# Patient Record
Sex: Female | Born: 1975 | Race: Black or African American | Hispanic: No | Marital: Single | Smoking: Never smoker
Health system: Southern US, Community
[De-identification: ages and names within clinical notes are randomized; demographics above are authoritative.]

## PROBLEM LIST (undated history)

## (undated) DIAGNOSIS — E282 Polycystic ovarian syndrome: Secondary | ICD-10-CM

## (undated) DIAGNOSIS — T148XXA Other injury of unspecified body region, initial encounter: Secondary | ICD-10-CM

## (undated) DIAGNOSIS — R011 Cardiac murmur, unspecified: Secondary | ICD-10-CM

## (undated) DIAGNOSIS — N63 Unspecified lump in unspecified breast: Secondary | ICD-10-CM

## (undated) DIAGNOSIS — Z9889 Other specified postprocedural states: Secondary | ICD-10-CM

## (undated) DIAGNOSIS — D649 Anemia, unspecified: Secondary | ICD-10-CM

## (undated) DIAGNOSIS — K219 Gastro-esophageal reflux disease without esophagitis: Secondary | ICD-10-CM

## (undated) DIAGNOSIS — R112 Nausea with vomiting, unspecified: Secondary | ICD-10-CM

## (undated) HISTORY — PX: COLONOSCOPY: SHX174

## (undated) HISTORY — PX: UPPER GI ENDOSCOPY: SHX6162

## (undated) HISTORY — DX: Unspecified lump in unspecified breast: N63.0

---
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
## 2011-07-19 ENCOUNTER — Other Ambulatory Visit (HOSPITAL_COMMUNITY)
Admission: RE | Admit: 2011-07-19 | Discharge: 2011-07-19 | Disposition: A | Discharge status: Home / Self Care | Payer: BC Managed Care – PPO | Source: Ambulatory Visit | Attending: Obstetrics & Gynecology | Admitting: Obstetrics & Gynecology

## 2011-07-19 DIAGNOSIS — Z1159 Encounter for screening for other viral diseases: Secondary | ICD-10-CM | POA: Insufficient documentation

## 2011-07-19 DIAGNOSIS — Z124 Encounter for screening for malignant neoplasm of cervix: Secondary | ICD-10-CM | POA: Insufficient documentation

## 2011-08-16 ENCOUNTER — Encounter (INDEPENDENT_AMBULATORY_CARE_PROVIDER_SITE_OTHER): Payer: Self-pay | Admitting: General Surgery

## 2011-08-16 ENCOUNTER — Ambulatory Visit (INDEPENDENT_AMBULATORY_CARE_PROVIDER_SITE_OTHER): Payer: BC Managed Care – PPO | Admitting: General Surgery

## 2011-08-16 VITALS — BP 114/82 | HR 72 | Resp 18 | Ht 68.5 in | Wt 218.0 lb

## 2011-08-16 DIAGNOSIS — N63 Unspecified lump in unspecified breast: Secondary | ICD-10-CM

## 2011-08-16 DIAGNOSIS — N6312 Unspecified lump in the right breast, upper inner quadrant: Secondary | ICD-10-CM

## 2011-08-16 NOTE — Patient Instructions (Signed)
Perform monthly self breast exam. If you notice changes, please call.

## 2011-08-16 NOTE — Progress Notes (Signed)
Patient ID: Katie Sellers, female   DOB: April 18, 1976, 36 y.o.   MRN: [id number]  Chief Complaint  Patient presents with  . Breast Mass    new pt- eval Rt breast mass    HPI Katie Sellers is a 36 y.o. female.   HPI 36 year old [place] American female referred by Dr. Christell Constant for a right breast lump. The patient states that she has had a lump in that area for many years. She states that it does not cause any discomfort or pain. She states that new physician noticed it and pointed it out on physical exam. She was brought back several weeks later for followup exam and that physician noticed that the area had increased in size. This prompted mammogram and right breast ultrasound. She states that she has had some intermittent right breast nipple discharge. It is both spontaneous and occurs on palpation sometimes. It is clear. Menarche occurred at age 75. She has had irregular menses ever since she started menstruating. She generally has heavy flow. She has been oral contraceptives for her heavy flow. However she recently stopped them. She denies any personal or family history of breast cancer, colon cancer, prostate cancer or ovarian cancer. Her father had melanoma. Past Medical History  Diagnosis Date  . Breast mass     right breast    Past Surgical History  Procedure Date  . Foot surgery 1989    bilateral    Family History  Problem Relation Age of Onset  . Heart disease Father   . Heart disease Paternal Grandmother     Social History History  Substance Use Topics  . Smoking status: Never Smoker   . Smokeless tobacco: Not on file  . Alcohol Use: No    Allergies  Allergen Reactions  . Penicillins Hives  . Tramadol Hives    No current outpatient prescriptions on file.    Review of Systems Review of Systems  Constitutional: Negative for fever, chills and unexpected weight change.  HENT: Negative for hearing loss, congestion, sore throat, trouble swallowing and voice change.     Eyes: Negative for visual disturbance.  Respiratory: Negative for cough and wheezing.   Cardiovascular: Negative for chest pain, palpitations and leg swelling.  Gastrointestinal: Negative for nausea, vomiting, abdominal pain, diarrhea, constipation, blood in stool, abdominal distention and anal bleeding.  Genitourinary: Positive for vaginal bleeding and menstrual problem. Negative for hematuria and difficulty urinating.  Musculoskeletal: Negative for arthralgias.  Skin: Negative for rash and wound.  Neurological: Negative for seizures, syncope and headaches.  Hematological: Negative for adenopathy. Does not bruise/bleed easily.  Psychiatric/Behavioral: Negative for confusion.    Blood pressure 114/82, pulse 72, resp. rate 18, height 5' 8.5" (1.74 m), weight 218 lb (98.884 kg).  Physical Exam Physical Exam  Vitals reviewed. Constitutional: She is oriented to person, place, and time. She appears well-developed and well-nourished. No distress.  HENT:  Head: Normocephalic and atraumatic.  Right Ear: External ear normal.  Left Ear: External ear normal.  Nose: Nose normal.  Eyes: Conjunctivae are normal.  Neck: Normal range of motion. Neck supple. No tracheal deviation present. No thyromegaly present.  Cardiovascular: Normal rate, regular rhythm and normal heart sounds.   Pulmonary/Chest: Effort normal and breath sounds normal. No respiratory distress. She has no wheezes. Right breast exhibits nipple discharge (clear, tiny drop on compression). Right breast exhibits no inverted nipple, no skin change and no tenderness. Left breast exhibits no inverted nipple, no nipple discharge, no skin change and no tenderness.  Breasts are symmetrical.         subcu breast lump at 1o'clock about 6cm from NAC. Ill defined. Nontender. No overlying skin changes. About 1 inch x 0.5 inch  Abdominal: Soft. Bowel sounds are normal. She exhibits no distension. There is no tenderness.  Musculoskeletal: Normal  range of motion. She exhibits no edema and no tenderness.  Lymphadenopathy:    She has no cervical adenopathy.    She has no axillary adenopathy.       Right: No supraclavicular adenopathy present.       Left: No supraclavicular adenopathy present.  Neurological: She is alert and oriented to person, place, and time. She exhibits normal muscle tone.  Skin: Skin is warm and dry. No rash noted. She is not diaphoretic. No erythema.  Psychiatric: She has a normal mood and affect. Her behavior is normal. Judgment and thought content normal.    Data Reviewed Dr Paralee Cancel note B/l mammo and rt breast u/s report and images from 08/11/11  Assessment    Right breast lump at 1 o'clock    Plan    She does have a small palpable lump in her right breast. To me this is consistent with fibrocystic breast. Her mammogram and ultrasound are reassuring. However I would like to bring the patient back in 3 months for another exam. If the lump is still present, we will discuss incisional biopsy. I also encouraged her to self breast exams. I instructed her to call our office should she notice a change.  We also discussed fibrocystic breast disease.  Followup 3 months  Mary Sella. Andrey Campanile, MD, FACS General, Bariatric, & Minimally Invasive Surgery [facility], [place]        [facility] M 08/16/2011, 10:53 AM

## 2011-08-30 ENCOUNTER — Encounter (INDEPENDENT_AMBULATORY_CARE_PROVIDER_SITE_OTHER): Payer: Self-pay

## 2011-10-09 ENCOUNTER — Encounter (INDEPENDENT_AMBULATORY_CARE_PROVIDER_SITE_OTHER): Payer: Self-pay | Admitting: General Surgery

## 2015-08-17 ENCOUNTER — Emergency Department (HOSPITAL_BASED_OUTPATIENT_CLINIC_OR_DEPARTMENT_OTHER)
Admission: EM | Admit: 2015-08-17 | Discharge: 2015-08-17 | Disposition: A | Discharge status: Home / Self Care | Payer: No Typology Code available for payment source | Attending: Emergency Medicine | Admitting: Emergency Medicine

## 2015-08-17 ENCOUNTER — Encounter (HOSPITAL_BASED_OUTPATIENT_CLINIC_OR_DEPARTMENT_OTHER): Payer: Self-pay | Admitting: *Deleted

## 2015-08-17 ENCOUNTER — Emergency Department (HOSPITAL_BASED_OUTPATIENT_CLINIC_OR_DEPARTMENT_OTHER): Payer: No Typology Code available for payment source

## 2015-08-17 DIAGNOSIS — S39012A Strain of muscle, fascia and tendon of lower back, initial encounter: Secondary | ICD-10-CM | POA: Insufficient documentation

## 2015-08-17 DIAGNOSIS — Y999 Unspecified external cause status: Secondary | ICD-10-CM | POA: Diagnosis not present

## 2015-08-17 DIAGNOSIS — Y9389 Activity, other specified: Secondary | ICD-10-CM | POA: Insufficient documentation

## 2015-08-17 DIAGNOSIS — S8002XA Contusion of left knee, initial encounter: Secondary | ICD-10-CM | POA: Diagnosis not present

## 2015-08-17 DIAGNOSIS — Y929 Unspecified place or not applicable: Secondary | ICD-10-CM | POA: Insufficient documentation

## 2015-08-17 DIAGNOSIS — S161XXA Strain of muscle, fascia and tendon at neck level, initial encounter: Secondary | ICD-10-CM | POA: Diagnosis not present

## 2015-08-17 DIAGNOSIS — S199XXA Unspecified injury of neck, initial encounter: Secondary | ICD-10-CM | POA: Diagnosis present

## 2015-08-17 LAB — PREGNANCY, URINE: PREG TEST UR: NEGATIVE
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
## 2017-02-20 ENCOUNTER — Encounter (HOSPITAL_COMMUNITY): Payer: Self-pay

## 2017-02-20 NOTE — Patient Instructions (Addendum)
Katie Sellers  02/20/2017           Katie Sellers  02/20/2017      Your procedure is scheduled on 02/27/17   Report to [place].M.  Call this number if you have problems the morning of surgery:[phone]             OUR ADDRESS IS [address] , WE ARE LOCATED IN [place].    Remember:  Do not eat food or drink liquids after midnight.  Take these medicines the morning of surgery with A SIP OF WATER  Do not wear jewelry, make-up or nail polish.  Do not wear lotions, powders, or perfumes, or deoderant.  Do not shave 48 hours prior to surgery.  Men may shave face and neck.  Do not bring valuables to the hospital.  [facility] is not responsible for any belongings or valuables.  Contacts, dentures or bridgework may not be worn into surgery.  Leave your suitcase in the car.  After surgery it may be brought to your room.  For patients admitted to the hospital, discharge time will be determined by your treatment team.   Special instructions:   Please read over the following fact sheets that you were given.    __________________________________________________________             [facility] - Preparing for Surgery Before surgery, you can play an important role.  Because skin is not sterile, your skin needs to be as free of germs as possible.  You can reduce the number of germs on your skin by washing with CHG (chlorahexidine gluconate) soap before surgery.  CHG is an antiseptic cleaner which kills germs and bonds with the skin to continue killing germs even after washing. Please DO NOT use if you have an allergy to CHG or antibacterial soaps.  If your skin becomes reddened/irritated stop using the CHG and inform your nurse when you arrive at Short Stay. Do not shave (including legs and underarms) for at least 48 hours prior to the first CHG shower.  You may shave your face/neck. Please  follow these instructions carefully:  1.  Shower with CHG Soap the night before surgery and the  morning of Surgery.  2.  If you choose to wash your hair, wash your hair first as usual with your  normal  shampoo.  3.  After you shampoo, rinse your hair and body thoroughly to remove the  shampoo.                           4.  Use CHG as you would any other liquid soap.  You can apply chg directly  to the skin and wash                       Gently with a scrungie or clean washcloth.  5.  Apply the CHG Soap to your body ONLY FROM THE NECK DOWN.   Do not use on face/ open                           Wound or open sores. Avoid contact with eyes, ears mouth and  genitals (private parts).                       Wash face,  Genitals (private parts) with your normal soap.             6.  Wash thoroughly, paying special attention to the area where your surgery  will be performed.  7.  Thoroughly rinse your body with warm water from the neck down.  8.  DO NOT shower/wash with your normal soap after using and rinsing off  the CHG Soap.                9.  Pat yourself dry with a clean towel.            10.  Wear clean pajamas.            11.  Place clean sheets on your bed the night of your first shower and do not  sleep with pets. Day of Surgery : Do not apply any lotions/deodorants the morning of surgery.  Please wear clean clothes to the hospital/surgery center.  FAILURE TO FOLLOW THESE INSTRUCTIONS MAY RESULT IN THE CANCELLATION OF YOUR SURGERY PATIENT SIGNATURE_________________________________  NURSE SIGNATURE__________________________________  ________________________________________________________________________  WHAT IS A BLOOD TRANSFUSION? Blood Transfusion Information  A transfusion is the replacement of blood or some of its parts. Blood is made up of multiple cells which provide different functions.  Red blood cells carry oxygen and are used for blood loss replacement.  White blood cells  fight against infection.  Platelets control bleeding.  Plasma helps clot blood.  Other blood products are available for specialized needs, such as hemophilia or other clotting disorders. BEFORE THE TRANSFUSION  Who gives blood for transfusions?   Healthy volunteers who are fully evaluated to make sure their blood is safe. This is blood bank blood. Transfusion therapy is the safest it has ever been in the practice of medicine. Before blood is taken from a donor, a complete history is taken to make sure that person has no history of diseases nor engages in risky social behavior (examples are intravenous drug use or sexual activity with multiple partners). The donor's travel history is screened to minimize risk of transmitting infections, such as malaria. The donated blood is tested for signs of infectious diseases, such as HIV and hepatitis. The blood is then tested to be sure it is compatible with you in order to minimize the chance of a transfusion reaction. If you or a relative donates blood, this is often done in anticipation of surgery and is not appropriate for emergency situations. It takes many days to process the donated blood. RISKS AND COMPLICATIONS Although transfusion therapy is very safe and saves many lives, the main dangers of transfusion include:   Getting an infectious disease.  Developing a transfusion reaction. This is an allergic reaction to something in the blood you were given. Every precaution is taken to prevent this. The decision to have a blood transfusion has been considered carefully by your caregiver before blood is given. Blood is not given unless the benefits outweigh the risks. AFTER THE TRANSFUSION  Right after receiving a blood transfusion, you will usually feel much better and more energetic. This is especially true if your red blood cells have gotten low (anemic). The transfusion raises the level of the red blood cells which carry oxygen, and this usually  causes an energy increase.  The nurse administering the transfusion will monitor you carefully for complications.  HOME CARE INSTRUCTIONS  No special instructions are needed after a transfusion. You may find your energy is better. Speak with your caregiver about any limitations on activity for underlying diseases you may have. SEEK MEDICAL CARE IF:   Your condition is not improving after your transfusion.  You develop redness or irritation at the intravenous (IV) site. SEEK IMMEDIATE MEDICAL CARE IF:  Any of the following symptoms occur over the next 12 hours:  Shaking chills.  You have a temperature by mouth above 102 F (38.9 C), not controlled by medicine.  Chest, back, or muscle pain.  People around you feel you are not acting correctly or are confused.  Shortness of breath or difficulty breathing.  Dizziness and fainting.  You get a rash or develop hives.  You have a decrease in urine output.  Your urine turns a dark color or changes to pink, red, or brown. Any of the following symptoms occur over the next 10 days:  You have a temperature by mouth above 102 F (38.9 C), not controlled by medicine.  Shortness of breath.  Weakness after normal activity.  The white part of the eye turns yellow (jaundice).  You have a decrease in the amount of urine or are urinating less often.  Your urine turns a dark color or changes to pink, red, or brown. Document Released: 04/21/2000 Document Revised: 07/17/2011 Document Reviewed: 12/09/2007 [facility] Patient Information 2014 [place], [place].  _______________________________________________________________________

## 2017-02-21 ENCOUNTER — Encounter (HOSPITAL_COMMUNITY): Payer: Self-pay

## 2017-02-21 ENCOUNTER — Encounter (HOSPITAL_COMMUNITY)
Admission: RE | Admit: 2017-02-21 | Discharge: 2017-02-21 | Disposition: A | Discharge status: Home / Self Care | Payer: BLUE CROSS/BLUE SHIELD | Source: Ambulatory Visit | Attending: Obstetrics and Gynecology | Admitting: Obstetrics and Gynecology

## 2017-02-21 DIAGNOSIS — D259 Leiomyoma of uterus, unspecified: Secondary | ICD-10-CM | POA: Diagnosis not present

## 2017-02-21 DIAGNOSIS — Z01818 Encounter for other preprocedural examination: Secondary | ICD-10-CM | POA: Insufficient documentation

## 2017-02-21 HISTORY — DX: Other specified postprocedural states: Z98.890

## 2017-02-21 HISTORY — DX: Other injury of unspecified body region, initial encounter: T14.8XXA

## 2017-02-21 HISTORY — DX: Gastro-esophageal reflux disease without esophagitis: K21.9

## 2017-02-21 HISTORY — DX: Nausea with vomiting, unspecified: R11.2

## 2017-02-21 HISTORY — DX: Cardiac murmur, unspecified: R01.1

## 2017-02-21 HISTORY — DX: Anemia, unspecified: D64.9

## 2017-02-21 HISTORY — DX: Polycystic ovarian syndrome: E28.2

## 2017-02-21 LAB — CBC
HCT: 32.4 % — ABNORMAL LOW (ref 36.0–46.0)
HEMOGLOBIN: 10.3 g/dL — AB (ref 12.0–15.0)
MCH: 20.4 pg — ABNORMAL LOW (ref 26.0–34.0)
MCHC: 31.8 g/dL (ref 30.0–36.0)
MCV: 64.2 fL — ABNORMAL LOW (ref 78.0–100.0)
Platelets: 365 10*3/uL (ref 150–400)
RBC: 5.05 MIL/uL (ref 3.87–5.11)
RDW: 17.7 % — ABNORMAL HIGH (ref 11.5–15.5)
WBC: 10.3 10*3/uL (ref 4.0–10.5)

## 2017-02-21 LAB — HCG, SERUM, QUALITATIVE: Preg, Serum: NEGATIVE

## 2017-02-21 NOTE — Progress Notes (Signed)
CBC done 02/21/17 faxed via epic to DR Kerin Perna.

## 2017-02-22 LAB — ABO/RH: ABO/RH(D): O POS

## 2017-02-22 NOTE — Progress Notes (Addendum)
LVMM for patient with surgery time change to arrive at 0830am  On 02/27/17.  Asked patient to call back to know she received message.  Patient called back and aware of above.

## 2017-02-26 MED ORDER — GENTAMICIN SULFATE 40 MG/ML IJ SOLN
INTRAVENOUS | Status: AC
  Administered 2017-02-27: 410 mL via INTRAVENOUS
  Filled 2017-02-26 (×2): qty 10.25

## 2017-02-26 NOTE — H&P (Addendum)
Katie Sellers is a 41 y.o. female , originally referred to me by Dr. Odella Aquas, for Gel -port assisted myomectomy and possible chromotubation.  She was diagnosed with fibroids and possible tubal adhesions because of abnormal pelvic ultrasounds and HSG results.  She developed abnormal bleeding and had to stop the pill.  She has been having monthly periods but with heavy flow and prolonged duration.  Patient would like to preserve her childbearing potential.  Pertinent Gynecological History: Menses: flow is excessive with use of 3 pads or tampons on heaviest days Bleeding: dysfunctional uterine bleeding Contraception: none DES exposure: denies Blood transfusions: none Sexually transmitted diseases: no past history Previous GYN Procedures: N\A Last mammogram: normal Last pap: normal  OB History:G0P0   Menstrual History: Menarche age: 21 No LMP recorded.    Past Medical History:  Diagnosis Date  . Anemia   . Breast mass    right breast, fatty tissue  . GERD (gastroesophageal reflux disease)   . Heart murmur   . Nerve damage    neck  . PCOS (polycystic ovarian syndrome)   . PONV (postoperative nausea and vomiting)                     Past Surgical History:  Procedure Laterality Date  . COLONOSCOPY    . FOOT SURGERY  1989   bilateral  . UPPER GI ENDOSCOPY               Family History  Problem Relation Age of Onset  . Heart disease Father   . Heart disease Paternal Grandmother    No hereditary disease.  No cancer of breast, ovary, uterus. No cutaneous leiomyomatosis or renal cell carcinoma.  Social History   Social History  . Marital status: Single    Spouse name: N/A  . Number of children: N/A  . Years of education: N/A   Occupational History  . Not on file.   Social History Main Topics  . Smoking status: Never Smoker  . Smokeless tobacco: Never Used  . Alcohol use No  . Drug use: No  . Sexual activity: Not on file   Other Topics Concern  . Not on  file   Social History Narrative  . No narrative on file    Allergies  Allergen Reactions  . Penicillins Hives    Has patient had a PCN reaction causing immediate rash, facial/tongue/throat swelling, SOB or lightheadedness with hypotension: Unknown Has patient had a PCN reaction causing severe rash involving mucus membranes or skin necrosis: Yes Has patient had a PCN reaction that required hospitalization: No Has patient had a PCN reaction occurring within the last 10 years: Yes - Can take other PCN's, just not Penicillin If all of the above answers are "NO", then may proceed with Cephalosporin use.   . Tramadol Nausea And Vomiting    No current facility-administered medications on file prior to encounter.    No current outpatient prescriptions on file prior to encounter.     Review of Systems  Constitutional: Negative.   HENT: Negative.   Eyes: Negative.   Respiratory: Negative.   Cardiovascular: Negative.   Gastrointestinal: Negative.   Genitourinary: Negative.   Musculoskeletal: Negative.   Skin: Negative.   Neurological: Negative.   Endo/Heme/Allergies: Negative.   Psychiatric/Behavioral: Negative.      Physical Exam  BP 126/67   Pulse 87   Temp 98.8 F (37.1 C)   Resp (!) 24   Ht 5\' 7"  (1.702 m)  Wt 114.8 kg (253 lb)   SpO2 100%   BMI 39.63 kg/m    Constitutional: She is oriented to person, place, and time. She appears well-developed and well-nourished.  HENT:  Head: Normocephalic and atraumatic.  Nose: Nose normal.  Mouth/Throat: Oropharynx is clear and moist. No oropharyngeal exudate.  Eyes: Conjunctivae normal and EOM are normal. Pupils are equal, round, and reactive to light. No scleral icterus.  Neck: Normal range of motion. Neck supple. No tracheal deviation present. No thyromegaly present.  Cardiovascular: Normal rate.   Respiratory: Effort normal and breath sounds normal.  GI: Soft. Bowel sounds are normal. She exhibits no distension and no  mass. There is no tenderness.  Lymphadenopathy:    She has no cervical adenopathy.  Neurological: She is alert and oriented to person, place, and time. She has normal reflexes.  Skin: Skin is warm.  Psychiatric: She has a normal mood and affect. Her behavior is normal. Judgment and thought content normal.       Assessment/Plan:  Intramural and subserosal uterine myomas, causing menorrhagia, pressure sensation and possible tubal adhesions. Preoperative for Gel -port assisted myomectomy , chromotubation Benefits and risks of Gel -port assisted myomectomy , chromotubation were discussed with the patient and her family member again.  Bowel prep instructions were given.  All of patient's questions were answered.  She verbalized understanding.

## 2017-02-27 ENCOUNTER — Encounter (HOSPITAL_BASED_OUTPATIENT_CLINIC_OR_DEPARTMENT_OTHER)
Admission: RE | Disposition: A | Discharge status: Home / Self Care | Payer: Self-pay | Source: Ambulatory Visit | Attending: Obstetrics and Gynecology

## 2017-02-27 ENCOUNTER — Ambulatory Visit (HOSPITAL_BASED_OUTPATIENT_CLINIC_OR_DEPARTMENT_OTHER)
Admission: RE | Admit: 2017-02-27 | Discharge: 2017-02-27 | Disposition: A | Discharge status: Home / Self Care | Payer: BLUE CROSS/BLUE SHIELD | Source: Ambulatory Visit | Attending: Obstetrics and Gynecology | Admitting: Obstetrics and Gynecology

## 2017-02-27 ENCOUNTER — Encounter (HOSPITAL_BASED_OUTPATIENT_CLINIC_OR_DEPARTMENT_OTHER): Payer: Self-pay

## 2017-02-27 ENCOUNTER — Ambulatory Visit (HOSPITAL_BASED_OUTPATIENT_CLINIC_OR_DEPARTMENT_OTHER): Payer: BLUE CROSS/BLUE SHIELD | Admitting: Anesthesiology

## 2017-02-27 DIAGNOSIS — N920 Excessive and frequent menstruation with regular cycle: Secondary | ICD-10-CM | POA: Insufficient documentation

## 2017-02-27 DIAGNOSIS — K219 Gastro-esophageal reflux disease without esophagitis: Secondary | ICD-10-CM | POA: Diagnosis not present

## 2017-02-27 DIAGNOSIS — D251 Intramural leiomyoma of uterus: Secondary | ICD-10-CM | POA: Insufficient documentation

## 2017-02-27 DIAGNOSIS — N838 Other noninflammatory disorders of ovary, fallopian tube and broad ligament: Secondary | ICD-10-CM | POA: Diagnosis not present

## 2017-02-27 DIAGNOSIS — D252 Subserosal leiomyoma of uterus: Secondary | ICD-10-CM | POA: Diagnosis not present

## 2017-02-27 HISTORY — PX: LAPAROSCOPIC GELPORT ASSISTED MYOMECTOMY: SHX6549

## 2017-02-27 HISTORY — PX: CHROMOPERTUBATION: SHX6288

## 2017-02-27 LAB — TYPE AND SCREEN
ABO/RH(D): O POS
ANTIBODY SCREEN: NEGATIVE

## 2017-02-27 SURGERY — LAPAROSCOPIC GELPORT ASSISTED MYOMECTOMY
Anesthesia: General | Site: Vagina

## 2017-02-27 MED ORDER — SUCCINYLCHOLINE CHLORIDE 200 MG/10ML IV SOSY
PREFILLED_SYRINGE | INTRAVENOUS | Status: DC | PRN
  Administered 2017-02-27: 120 mg via INTRAVENOUS

## 2017-02-27 MED ORDER — ROCURONIUM BROMIDE 50 MG/5ML IV SOSY
PREFILLED_SYRINGE | INTRAVENOUS | Status: AC
  Filled 2017-02-27: qty 5

## 2017-02-27 MED ORDER — CELECOXIB 200 MG PO CAPS
ORAL_CAPSULE | ORAL | Status: AC
  Filled 2017-02-27: qty 2

## 2017-02-27 MED ORDER — PROMETHAZINE HCL 25 MG/ML IJ SOLN
6.2500 mg | INTRAMUSCULAR | Status: DC | PRN
  Filled 2017-02-27: qty 1

## 2017-02-27 MED ORDER — LIDOCAINE 2% (20 MG/ML) 5 ML SYRINGE
INTRAMUSCULAR | Status: DC | PRN
  Administered 2017-02-27: 60 mg via INTRAVENOUS

## 2017-02-27 MED ORDER — PROPOFOL 10 MG/ML IV BOLUS
INTRAVENOUS | Status: DC | PRN
  Administered 2017-02-27: 50 mg via INTRAVENOUS
  Administered 2017-02-27: 200 mg via INTRAVENOUS

## 2017-02-27 MED ORDER — DEXAMETHASONE SODIUM PHOSPHATE 10 MG/ML IJ SOLN
INTRAMUSCULAR | Status: AC
  Filled 2017-02-27: qty 1

## 2017-02-27 MED ORDER — OXYCODONE-ACETAMINOPHEN 5-325 MG PO TABS
1.0000 | ORAL_TABLET | Freq: Once | ORAL | Status: AC
  Administered 2017-02-27: 1 via ORAL
  Filled 2017-02-27: qty 1

## 2017-02-27 MED ORDER — FENTANYL CITRATE (PF) 100 MCG/2ML IJ SOLN
INTRAMUSCULAR | Status: DC | PRN
  Administered 2017-02-27: 50 ug via INTRAVENOUS
  Administered 2017-02-27: 150 ug via INTRAVENOUS
  Administered 2017-02-27: 50 ug via INTRAVENOUS
  Administered 2017-02-27: 100 ug via INTRAVENOUS

## 2017-02-27 MED ORDER — SCOPOLAMINE 1 MG/3DAYS TD PT72
1.0000 | MEDICATED_PATCH | Freq: Once | TRANSDERMAL | Status: AC
  Administered 2017-02-27: 1 via TRANSDERMAL
  Administered 2017-02-27: 1.5 mg via TRANSDERMAL
  Filled 2017-02-27: qty 1

## 2017-02-27 MED ORDER — FENTANYL CITRATE (PF) 100 MCG/2ML IJ SOLN
INTRAMUSCULAR | Status: AC
  Filled 2017-02-27: qty 2

## 2017-02-27 MED ORDER — MIDAZOLAM HCL 2 MG/2ML IJ SOLN
INTRAMUSCULAR | Status: AC
  Filled 2017-02-27: qty 2

## 2017-02-27 MED ORDER — EPHEDRINE SULFATE 50 MG/ML IJ SOLN
INTRAMUSCULAR | Status: DC | PRN
  Administered 2017-02-27: 10 mg via INTRAVENOUS

## 2017-02-27 MED ORDER — FENTANYL CITRATE (PF) 100 MCG/2ML IJ SOLN
25.0000 ug | INTRAMUSCULAR | Status: DC | PRN
  Administered 2017-02-27 (×2): 25 ug via INTRAVENOUS
  Administered 2017-02-27: 50 ug via INTRAVENOUS
  Filled 2017-02-27: qty 1

## 2017-02-27 MED ORDER — PROPOFOL 10 MG/ML IV BOLUS
INTRAVENOUS | Status: AC
  Filled 2017-02-27: qty 20

## 2017-02-27 MED ORDER — SCOPOLAMINE 1 MG/3DAYS TD PT72
MEDICATED_PATCH | TRANSDERMAL | Status: AC
  Filled 2017-02-27: qty 1

## 2017-02-27 MED ORDER — METHYLENE BLUE 0.5 % INJ SOLN
INTRAVENOUS | Status: DC | PRN
  Administered 2017-02-27: 1 mL

## 2017-02-27 MED ORDER — SUGAMMADEX SODIUM 200 MG/2ML IV SOLN
INTRAVENOUS | Status: AC
  Filled 2017-02-27: qty 2

## 2017-02-27 MED ORDER — SODIUM CHLORIDE 0.9 % IR SOLN
Status: DC | PRN
  Administered 2017-02-27: 3000 mL

## 2017-02-27 MED ORDER — SUGAMMADEX SODIUM 500 MG/5ML IV SOLN
INTRAVENOUS | Status: AC
  Filled 2017-02-27: qty 5

## 2017-02-27 MED ORDER — ACETAMINOPHEN 500 MG PO TABS
ORAL_TABLET | ORAL | Status: AC
  Filled 2017-02-27: qty 2

## 2017-02-27 MED ORDER — ONDANSETRON HCL 4 MG/2ML IJ SOLN
INTRAMUSCULAR | Status: DC | PRN
Start: 2017-02-27 — End: 2017-02-27
  Administered 2017-02-27: 4 mg via INTRAVENOUS

## 2017-02-27 MED ORDER — SODIUM CHLORIDE 0.9 % IV SOLN
INTRAVENOUS | Status: DC | PRN
  Administered 2017-02-27: 20 mL via INTRAMUSCULAR

## 2017-02-27 MED ORDER — DEXAMETHASONE SODIUM PHOSPHATE 10 MG/ML IJ SOLN
INTRAMUSCULAR | Status: DC | PRN
  Administered 2017-02-27: 10 mg via INTRAVENOUS

## 2017-02-27 MED ORDER — MIDAZOLAM HCL 2 MG/2ML IJ SOLN
INTRAMUSCULAR | Status: DC | PRN
  Administered 2017-02-27: 2 mg via INTRAVENOUS

## 2017-02-27 MED ORDER — BUPIVACAINE-EPINEPHRINE 0.5% -1:200000 IJ SOLN
INTRAMUSCULAR | Status: DC | PRN
  Administered 2017-02-27: 13 mL

## 2017-02-27 MED ORDER — CELECOXIB 400 MG PO CAPS
400.0000 mg | ORAL_CAPSULE | Freq: Once | ORAL | Status: AC
  Administered 2017-02-27: 400 mg via ORAL
  Filled 2017-02-27: qty 1

## 2017-02-27 MED ORDER — FENTANYL CITRATE (PF) 250 MCG/5ML IJ SOLN
INTRAMUSCULAR | Status: AC
  Filled 2017-02-27: qty 5

## 2017-02-27 MED ORDER — ROCURONIUM BROMIDE 10 MG/ML (PF) SYRINGE
PREFILLED_SYRINGE | INTRAVENOUS | Status: DC | PRN
  Administered 2017-02-27: 10 mg via INTRAVENOUS
  Administered 2017-02-27: 20 mg via INTRAVENOUS
  Administered 2017-02-27: 40 mg via INTRAVENOUS

## 2017-02-27 MED ORDER — OXYCODONE-ACETAMINOPHEN 7.5-325 MG PO TABS: 1.0000 | ORAL_TABLET | ORAL | 0 refills | Status: AC | PRN

## 2017-02-27 MED ORDER — GABAPENTIN 300 MG PO CAPS
300.0000 mg | ORAL_CAPSULE | Freq: Once | ORAL | Status: AC
  Administered 2017-02-27: 300 mg via ORAL
  Filled 2017-02-27: qty 1

## 2017-02-27 MED ORDER — LACTATED RINGERS IV SOLN
INTRAVENOUS | Status: DC
  Administered 2017-02-27 (×4): via INTRAVENOUS
  Filled 2017-02-27: qty 1000

## 2017-02-27 MED ORDER — LIDOCAINE 2% (20 MG/ML) 5 ML SYRINGE
INTRAMUSCULAR | Status: AC
  Filled 2017-02-27: qty 5

## 2017-02-27 MED ORDER — SUGAMMADEX SODIUM 200 MG/2ML IV SOLN
INTRAVENOUS | Status: DC | PRN
  Administered 2017-02-27: 400 mg via INTRAVENOUS

## 2017-02-27 MED ORDER — ONDANSETRON HCL 4 MG PO TABS: 4.0000 mg | ORAL_TABLET | Freq: Every day | ORAL | 1 refills | Status: AC | PRN

## 2017-02-27 MED ORDER — OXYCODONE-ACETAMINOPHEN 5-325 MG PO TABS
ORAL_TABLET | ORAL | Status: AC
  Filled 2017-02-27: qty 1

## 2017-02-27 MED ORDER — GABAPENTIN 300 MG PO CAPS
ORAL_CAPSULE | ORAL | Status: AC
  Filled 2017-02-27: qty 1

## 2017-02-27 MED ORDER — ONDANSETRON HCL 4 MG/2ML IJ SOLN
INTRAMUSCULAR | Status: AC
  Filled 2017-02-27: qty 2

## 2017-02-27 MED ORDER — ACETAMINOPHEN 500 MG PO TABS
1000.0000 mg | ORAL_TABLET | Freq: Once | ORAL | Status: AC
  Administered 2017-02-27: 1000 mg via ORAL
  Filled 2017-02-27: qty 2

## 2017-02-27 MED FILL — OXYCODON-ACETAMINOPHEN 7.5-: 7.5-325 | 3 days supply | Qty: 20 | Fill #0

## 2017-02-27 SURGICAL SUPPLY — 73 items
CABLE HIGH FREQUENCY MONO STRZ (ELECTRODE) ×4 IMPLANT
CATH ROBINSON RED A/P 16FR (CATHETERS) IMPLANT
CATH UROLOGY TORQUE 40 (MISCELLANEOUS) ×4 IMPLANT
CLEANER CAUTERY TIP 5X5 PAD (MISCELLANEOUS) IMPLANT
CLOTH BEACON ORANGE TIMEOUT ST (SAFETY) ×4 IMPLANT
COVER MAYO STAND STRL (DRAPES) ×4 IMPLANT
DERMABOND ADVANCED (GAUZE/BANDAGES/DRESSINGS) ×2
DERMABOND ADVANCED .7 DNX12 (GAUZE/BANDAGES/DRESSINGS) ×2 IMPLANT
DEVICE TROCAR PUNCTURE CLOSURE (ENDOMECHANICALS) IMPLANT
DRSG COVADERM PLUS 2X2 (GAUZE/BANDAGES/DRESSINGS) ×4 IMPLANT
DRSG OPSITE POSTOP 3X4 (GAUZE/BANDAGES/DRESSINGS) ×4 IMPLANT
DRSG TEGADERM 4X4.75 (GAUZE/BANDAGES/DRESSINGS) IMPLANT
DRSG TELFA 3X8 NADH (GAUZE/BANDAGES/DRESSINGS) IMPLANT
DURAPREP 26ML APPLICATOR (WOUND CARE) ×4 IMPLANT
ELECT BLADE 6.5 .24CM SHAFT (ELECTRODE) IMPLANT
ELECT NEEDLE TIP 2.8 STRL (NEEDLE) ×4 IMPLANT
ELECT REM PT RETURN 9FT ADLT (ELECTROSURGICAL) ×4
ELECTRODE REM PT RTRN 9FT ADLT (ELECTROSURGICAL) ×2 IMPLANT
FILTER SMOKE EVAC LAPAROSHD (FILTER) IMPLANT
GLOVE BIO SURGEON STRL SZ 6.5 (GLOVE) ×9 IMPLANT
GLOVE BIO SURGEON STRL SZ8 (GLOVE) ×4 IMPLANT
GLOVE BIO SURGEONS STRL SZ 6.5 (GLOVE) ×3
GLOVE BIOGEL PI IND STRL 6.5 (GLOVE) ×6 IMPLANT
GLOVE BIOGEL PI IND STRL 7.5 (GLOVE) ×2 IMPLANT
GLOVE BIOGEL PI IND STRL 8.5 (GLOVE) ×2 IMPLANT
GLOVE BIOGEL PI INDICATOR 6.5 (GLOVE) ×6
GLOVE BIOGEL PI INDICATOR 7.5 (GLOVE) ×2
GLOVE BIOGEL PI INDICATOR 8.5 (GLOVE) ×2
GOWN STRL REUS W/ TWL LRG LVL3 (GOWN DISPOSABLE) IMPLANT
GOWN STRL REUS W/TWL LRG LVL3 (GOWN DISPOSABLE) ×16 IMPLANT
GUIDEWIRE STR DUAL SENSOR (WIRE) ×4 IMPLANT
HOLDER FOLEY CATH W/STRAP (MISCELLANEOUS) IMPLANT
KIT RM TURNOVER CYSTO AR (KITS) ×4 IMPLANT
MANIPULATOR UTERINE 4.5 ZUMI (MISCELLANEOUS) ×4 IMPLANT
NDL SAFETY ECLIPSE 18X1.5 (NEEDLE) ×2 IMPLANT
NEEDLE HYPO 18GX1.5 SHARP (NEEDLE) ×2
NEEDLE HYPO 25X1 1.5 SAFETY (NEEDLE) ×4 IMPLANT
NEEDLE INSUFFLATION 14GA 120MM (NEEDLE) ×4 IMPLANT
NS IRRIG 500ML POUR BTL (IV SOLUTION) ×8 IMPLANT
PACK LAPAROSCOPY BASIN (CUSTOM PROCEDURE TRAY) ×4 IMPLANT
PACK TRENDGUARD 450 HYBRID PRO (MISCELLANEOUS) ×2 IMPLANT
PAD CLEANER CAUTERY TIP 5X5 (MISCELLANEOUS)
PAD OB MATERNITY 4.3X12.25 (PERSONAL CARE ITEMS) ×4 IMPLANT
PENCIL BUTTON HOLSTER BLD 10FT (ELECTRODE) IMPLANT
POUCH SPECIMEN RETRIEVAL 10MM (ENDOMECHANICALS) IMPLANT
SEPRAFILM MEMBRANE 5X6 (MISCELLANEOUS) ×8 IMPLANT
SET IRRIG TUBING LAPAROSCOPIC (IRRIGATION / IRRIGATOR) ×4 IMPLANT
SHEARS HARMONIC ACE PLUS 36CM (ENDOMECHANICALS) IMPLANT
SPONGE LAP 18X18 X RAY DECT (DISPOSABLE) IMPLANT
SUT MNCRL AB 4-0 PS2 18 (SUTURE) ×4 IMPLANT
SUT PROLENE 0 CT 1 30 (SUTURE) IMPLANT
SUT VIC AB 0 CT1 36 (SUTURE) IMPLANT
SUT VIC AB 2-0 CT1 27 (SUTURE) ×8
SUT VIC AB 2-0 CT1 TAPERPNT 27 (SUTURE) ×8 IMPLANT
SUT VIC AB 2-0 CT2 27 (SUTURE) IMPLANT
SUT VIC AB 2-0 UR6 27 (SUTURE) IMPLANT
SUT VIC AB 4-0 SH 27 (SUTURE) ×4
SUT VIC AB 4-0 SH 27XANBCTRL (SUTURE) ×4 IMPLANT
SUT VICRYL 0 TIES 12 18 (SUTURE) IMPLANT
SYR 20CC LL (SYRINGE) ×4 IMPLANT
SYR 30ML LL (SYRINGE) ×4 IMPLANT
SYR 5ML LL (SYRINGE) ×4 IMPLANT
SYR CONTROL 10ML LL (SYRINGE) ×8 IMPLANT
SYS LAPSCP GELPORT 120MM (MISCELLANEOUS)
SYSTEM LAPSCP GELPORT 120MM (MISCELLANEOUS) IMPLANT
TOWEL OR 17X24 6PK STRL BLUE (TOWEL DISPOSABLE) ×12 IMPLANT
TRAY FOLEY CATH SILVER 14FR (SET/KITS/TRAYS/PACK) ×4 IMPLANT
TRENDGUARD 450 HYBRID PRO PACK (MISCELLANEOUS) ×4
TROCAR OPTI TIP 5M 100M (ENDOMECHANICALS) ×4 IMPLANT
TROCAR XCEL DIL TIP R 11M (ENDOMECHANICALS) IMPLANT
TUBING INSUF HEATED (TUBING) ×4 IMPLANT
WARMER LAPAROSCOPE (MISCELLANEOUS) ×4 IMPLANT
WATER STERILE IRR 500ML POUR (IV SOLUTION) IMPLANT

## 2017-02-27 NOTE — Op Note (Signed)
Operative Note  Preoperative diagnosis: Uterine fibroids, menorrhagia, bilateral proximal tubal occlusion  Postoperative diagnosis: Uterine fibroids, menorrhagia, rule out posterior wall adenomyoma, bilateral proximal tubal occlusion  Procedure: Laparoscopy, GelPort assisted myomectomy, chromotubation, bilateral transcervical tubal catheterization (attempted)  Anesthesia: Gen. endotracheal  Complications: None  Estimated blood loss: 50  Specimens: Uterine myoma and posterior uterine wall adenomyoma to pathology  Findings: On examination under anesthesia, external genitalia, Bartholin's, Skene's, and urethra were normal. The vagina was normal. The cervix was nulliparous and appeared grossly normal. The uterus was 14-15 week size, firm and mobile with irregularities caused by myomas. It sounded to 9.5 cm.  On laparoscopy, upper abdomen, liver surface and diaphragm surfaces were normal. Gallbladder was normal. The appendix was appeared normal.  The uterus contained a 3 x 2 cm left anterior fundal transmural myoma with the lower aspect of it attached to the endometrium. There was a posterior left-sided excised by 6 cm myoma/adenomyoma with anterior aspect attached to the endometrium, which was entered during the dissection. The left tube and ovary appeared normal proximally and distally. The right tube appeared normal proximally and distally. There was normal filling of the fallopian tubes during chromotubation and also during attempted bilateral transcervical tubal catheterization. The pelvic peritoneum looked mostly normal.    Description of the procedure: The patient was placed in dorsal supine position and general endotracheal anesthesia was given. Prophylactic antibiotics were given intravenously. Patient was placed in lithotomy position. She was prepped and draped in sterile manner. A Foley catheter was inserted into the bladder . A ZUMI catheter was placed into the uterine cavity.  This was connected to a syringe containing diluted methylene blue solution which was used to define the endometrium during the myomectomy. The uterus sounded to 9.5 cm. The surgeon was regloved and a surgical field was created on the abdomen.  After preemptive anesthesia of all surgical sites with 0.5% bupivacaine, a 5 mm intraumbilical skin incision was made and a Verress needle was inserted. Its correct location was confirmed. A pneumoperitoneum was created with carbon dioxide.  5 mm laparoscope with a 30 lens was inserted and video laparoscopy was started . A left lower quadrant 5 mm and a right lower quadrant  5 mm incisions were made and ancillary trochars were placed under direct visualization. Above findings were noted.  A dilute solution of vasopressin (0.4 units per mL) was injected into the myometrium overlying the fundal myoma, until the myometrium blanched. A needle electrode with 14 W of cutting current  was used to make a transverse incision on the myometrium overlying the fundal 3 cm myoma. The myoma was grasped with tenaculum and dissection was started.  We then made a 3 cm transverse suprapubic incision to insert a GelPort. After dissection of the anatomic layers, the peritoneal cavity was entered. A GelPort was placed and the rest of the case was performed either using this port as a laparoscopic port or as a minilaparotomy port.   The myoma defect was closed in 3 layers: The first layer was a deep myometrial suture of 2-0 Vicryl continuous interlocking suture, the second layer was a superficial myometrial layer of 2-0 Vicryl continuous suture. A 4-0 Vicryl continuous suture was placed on the serosa and the most superficial myometrium.  Attention was then turned to the 6 x 6 cm solid mass posterior left uterine corpus. The myometrium overlying the mass was incised and poorly defined adenomyoma light mass was encountered. Using traction on the mass and Metzenbaum scissors carefully  dissected free from the surrounding normal myometrium. The endometrial cavity was incorporated to the anterior surface of the mass and the cavity was entered during the dissection for a 1 cm length. The resulting defect was closed in 3 layers. The first layer was a 2-0 Vicryl pursestring suture of the second layer was a 2-0 Vicryl continuous interlocking suture the third layer was a 4-0 Vicryl continuous suture on the serosa and superficial myometrium.  At this point the surgeon took a position between the patient's legs and we inserted a 5.5 French torque catheter transcervically and try to a mid into first right tubal ostium then to the left tubal ostium. Then a Glidewire (guidewire) with 0.015 inch tip was tried to be passed into the proximal tube on either side. This was not successful and chromotubation was reattempted through the [place] catheter, confirming persistence of bilateral proximal tubal occlusion. This procedure was terminated the surgeon took his place by the patient's side after regowning and regloving. The suprapubic fascial incision was closed with 2-0 Vicryl continuous suture. Subcutaneous tissue was irrigated and aspirated good hemostasis was achieved. The abdomen and the pelvis was carefully inspected under laparoscopic visualization and the pelvis was copiously irrigated and aspirated. A slurry of 2 sheets of Seprafilm in 60 mL of normal saline was injected as an adhesion barrier into the pelvis. The gas was allowed to escape. The instrument and the lap pad count were correct. The trochars were removed. The skin incisions were approximated with 4-0 Monocryl in subcuticular sutures, including the 3 cm skin incision that belonged to the [place] site.  The patient tolerated the procedure well and was transferred to recovery room in satisfactory condition.  SPECIAL NOTE: Because of the extent of the myometrial incision during the uterine myomectomy, it is recommended that this patient  deliver by a cesarean section with her future pregnancies.  Governor Specking, MD

## 2017-02-27 NOTE — Discharge Instructions (Signed)

## 2017-02-27 NOTE — Anesthesia Procedure Notes (Signed)
Procedure Name: Intubation Date/Time: 02/27/2017 12:15 PM Performed by: Wanita Chamberlain Pre-anesthesia Checklist: Patient identified, Emergency Drugs available, Suction available, Patient being monitored and Timeout performed Patient Re-evaluated:Patient Re-evaluated prior to induction Oxygen Delivery Method: Circle system utilized Preoxygenation: Pre-oxygenation with 100% oxygen Induction Type: IV induction Ventilation: Mask ventilation without difficulty Laryngoscope Size: Mac and 3 Grade View: Grade I Tube type: Oral Tube size: 7.0 mm Number of attempts: 1 Airway Equipment and Method: Stylet Placement Confirmation: ETT inserted through vocal cords under direct vision,  positive ETCO2 and breath sounds checked- equal and bilateral Secured at: 22 cm Tube secured with: Tape Dental Injury: Teeth and Oropharynx as per pre-operative assessment

## 2017-02-27 NOTE — Anesthesia Postprocedure Evaluation (Signed)
Anesthesia Post Note  Patient: Katie Sellers  Procedure(s) Performed: LAPAROSCOPIC GELPORT ASSISTED MYOMECTOMY (N/A Abdomen) CHROMOPERTUBATION,  BILATERAL TRANSCERVICAL TUBAL CATHETERIZATION " ATTEMPTED" (N/A Vagina )     Patient location during evaluation: PACU Anesthesia Type: General Level of consciousness: awake and alert Pain management: pain level controlled Vital Signs Assessment: post-procedure vital signs reviewed and stable Respiratory status: spontaneous breathing, nonlabored ventilation, respiratory function stable and patient connected to nasal cannula oxygen Cardiovascular status: blood pressure returned to baseline and stable Postop Assessment: no apparent nausea or vomiting Anesthetic complications: no    Last Vitals:  Vitals:   02/27/17 1442 02/27/17 1445  BP: 130/72 126/67  Pulse: 88 87  Resp: (!) 25 (!) 24  Temp: 37.1 C   SpO2: 98% 100%    Last Pain:  Vitals:   02/27/17 0853  TempSrc:   PainSc: 2                  Amyre Segundo S

## 2017-02-27 NOTE — Transfer of Care (Signed)
Immediate Anesthesia Transfer of Care Note  Patient: Katie Sellers  Procedure(s) Performed: LAPAROSCOPIC GELPORT ASSISTED MYOMECTOMY (N/A Abdomen) CHROMOPERTUBATION,  BILATERAL TRANSCERVICAL TUBAL CATHETERIZATION " ATTEMPTED" (N/A Vagina )  Patient Location: PACU  Anesthesia Type:General  Level of Consciousness: awake, alert , oriented and patient cooperative  Airway & Oxygen Therapy: Patient Spontanous Breathing and Patient connected to face mask oxygen  Post-op Assessment: Report given to RN and Post -op Vital signs reviewed and stable  Post vital signs: Reviewed and stable  Last Vitals:  Vitals:   02/27/17 0827  BP: 130/87  Pulse: 70  Resp: 19  Temp: 36.6 C  SpO2: 100%    Last Pain:  Vitals:   02/27/17 0853  TempSrc:   PainSc: 2       Patients Stated Pain Goal: 3 ([id number])  Complications: No apparent anesthesia complications

## 2017-02-27 NOTE — Anesthesia Preprocedure Evaluation (Addendum)
Anesthesia Evaluation  Patient identified by MRN, date of birth, ID band Patient awake    Reviewed: Allergy & Precautions, NPO status , Patient's Chart, lab work & pertinent test results  History of Anesthesia Complications (+) PONV and history of anesthetic complications  Airway Mallampati: II  TM Distance: >3 FB Neck ROM: Full    Dental  (+) Teeth Intact, Dental Advisory Given   Pulmonary neg pulmonary ROS,    Pulmonary exam normal breath sounds clear to auscultation       Cardiovascular negative cardio ROS Normal cardiovascular exam Rhythm:Regular Rate:Normal     Neuro/Psych Anxiety negative neurological ROS  negative psych ROS   GI/Hepatic Neg liver ROS, GERD  Medicated,  Endo/Other  Morbid obesityPCOS  Renal/GU negative Renal ROS     Musculoskeletal negative musculoskeletal ROS (+)   Abdominal   Peds  Hematology  (+) Blood dyscrasia, anemia ,   Anesthesia Other Findings Day of surgery medications reviewed with the patient.  Reproductive/Obstetrics negative OB ROS                            Anesthesia Physical Anesthesia Plan  ASA: III  Anesthesia Plan: General   Post-op Pain Management:    Induction: Intravenous  PONV Risk Score and Plan: 4 or greater and Ondansetron, Dexamethasone, Midazolam, Scopolamine patch - Pre-op, Promethazine and Treatment may vary due to age or medical condition  Airway Management Planned: Oral ETT  Additional Equipment:   Intra-op Plan:   Post-operative Plan: Extubation in OR  Informed Consent: I have reviewed the patients History and Physical, chart, labs and discussed the procedure including the risks, benefits and alternatives for the proposed anesthesia with the patient or authorized representative who has indicated his/her understanding and acceptance.   Dental advisory given  Plan Discussed with: CRNA  Anesthesia Plan Comments:  (Risks/benefits of general anesthesia discussed with patient including risk of damage to teeth, lips, gum, and tongue, nausea/vomiting, allergic reactions to medications, and the possibility of heart attack, stroke and death.  All patient questions answered.  Patient wishes to proceed.)        Anesthesia Pet[place]i Evaluation

## 2017-02-28 ENCOUNTER — Encounter (HOSPITAL_BASED_OUTPATIENT_CLINIC_OR_DEPARTMENT_OTHER): Payer: Self-pay | Admitting: Obstetrics and Gynecology
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
## 2020-06-27 ENCOUNTER — Encounter (HOSPITAL_BASED_OUTPATIENT_CLINIC_OR_DEPARTMENT_OTHER): Payer: Self-pay | Admitting: Emergency Medicine

## 2020-06-27 ENCOUNTER — Emergency Department (HOSPITAL_BASED_OUTPATIENT_CLINIC_OR_DEPARTMENT_OTHER)
Admission: EM | Admit: 2020-06-27 | Discharge: 2020-06-27 | Disposition: A | Discharge status: Home / Self Care | Payer: BLUE CROSS/BLUE SHIELD | Attending: Emergency Medicine | Admitting: Emergency Medicine

## 2020-06-27 ENCOUNTER — Other Ambulatory Visit: Payer: Self-pay

## 2020-06-27 DIAGNOSIS — M25511 Pain in right shoulder: Secondary | ICD-10-CM | POA: Insufficient documentation

## 2020-06-27 DIAGNOSIS — M25561 Pain in right knee: Secondary | ICD-10-CM
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
## 2020-06-27 NOTE — ED Triage Notes (Signed)
States she stood up on Friday and felt a pop in her R knee. Also reports swelling. Went to UC, neg xray. Also c/o R shoulder pain after hitting it on Saturday.

## 2020-06-27 NOTE — Discharge Instructions (Signed)
Please make sure to follow-up with orthopedics as they are the ones who can order the MRI for you.  Continue to use the knee brace, take Tylenol or ibuprofen for pain.  You may also use Voltaren or lidocaine patches.
